# Patient Record
Sex: Male | Born: 1980 | Race: Black or African American | Hispanic: No | Smoking: Former smoker
Health system: Southern US, Community
[De-identification: ages and names within clinical notes are randomized; demographics above are authoritative.]

## PROBLEM LIST (undated history)

## (undated) HISTORY — PX: APPENDECTOMY: SHX54

---

## 2006-09-01 ENCOUNTER — Inpatient Hospital Stay (HOSPITAL_COMMUNITY): Admission: EM | Admit: 2006-09-01 | Discharge: 2006-09-02 | Payer: Self-pay | Admitting: Emergency Medicine

## 2006-09-01 ENCOUNTER — Encounter (INDEPENDENT_AMBULATORY_CARE_PROVIDER_SITE_OTHER): Payer: Self-pay | Admitting: Surgery

## 2008-06-01 ENCOUNTER — Ambulatory Visit: Payer: Self-pay | Admitting: Internal Medicine

## 2008-06-01 DIAGNOSIS — M797 Fibromyalgia: Secondary | ICD-10-CM

## 2008-06-01 DIAGNOSIS — M790 Rheumatism, unspecified: Secondary | ICD-10-CM | POA: Insufficient documentation

## 2008-06-01 DIAGNOSIS — K029 Dental caries, unspecified: Secondary | ICD-10-CM | POA: Insufficient documentation

## 2009-01-28 ENCOUNTER — Emergency Department (HOSPITAL_COMMUNITY): Admission: EM | Admit: 2009-01-28 | Discharge: 2009-01-28 | Payer: Self-pay | Admitting: Emergency Medicine

## 2009-12-22 ENCOUNTER — Inpatient Hospital Stay (HOSPITAL_COMMUNITY): Admission: EM | Admit: 2009-12-22 | Discharge: 2009-12-25 | Payer: Self-pay | Admitting: Emergency Medicine

## 2010-01-08 ENCOUNTER — Ambulatory Visit: Payer: Self-pay | Admitting: Internal Medicine

## 2010-04-30 LAB — DIFFERENTIAL
Band Neutrophils: 0 % (ref 0–10)
Basophils Absolute: 0 10*3/uL (ref 0.0–0.1)
Basophils Relative: 0 % (ref 0–1)
Basophils Relative: 0 % (ref 0–1)
Basophils Relative: 1 % (ref 0–1)
Basophils Relative: 1 % (ref 0–1)
Basophils Relative: 2 % — ABNORMAL HIGH (ref 0–1)
Basophils Relative: 3 % — ABNORMAL HIGH (ref 0–1)
Basophils Relative: 6 % — ABNORMAL HIGH (ref 0–1)
Blasts: 0 %
Eosinophils Absolute: 0 10*3/uL (ref 0.0–0.7)
Eosinophils Absolute: 0 10*3/uL (ref 0.0–0.7)
Eosinophils Absolute: 0 10*3/uL (ref 0.0–0.7)
Eosinophils Absolute: 0 10*3/uL (ref 0.0–0.7)
Eosinophils Absolute: 0 10*3/uL (ref 0.0–0.7)
Eosinophils Absolute: 0 10*3/uL (ref 0.0–0.7)
Eosinophils Relative: 0 % (ref 0–5)
Eosinophils Relative: 0 % (ref 0–5)
Eosinophils Relative: 0 % (ref 0–5)
Eosinophils Relative: 0 % (ref 0–5)
Eosinophils Relative: 1 % (ref 0–5)
Eosinophils Relative: 1 % (ref 0–5)
Eosinophils Relative: 1 % (ref 0–5)
Lymphocytes Relative: 21 % (ref 12–46)
Lymphocytes Relative: 26 % (ref 12–46)
Lymphocytes Relative: 32 % (ref 12–46)
Lymphocytes Relative: 44 % (ref 12–46)
Lymphocytes Relative: 45 % (ref 12–46)
Lymphs Abs: 0.8 10*3/uL (ref 0.7–4.0)
Lymphs Abs: 1.1 10*3/uL (ref 0.7–4.0)
Metamyelocytes Relative: 0 %
Monocytes Absolute: 0 10*3/uL — ABNORMAL LOW (ref 0.1–1.0)
Monocytes Absolute: 0.1 10*3/uL (ref 0.1–1.0)
Monocytes Absolute: 0.4 10*3/uL (ref 0.1–1.0)
Monocytes Absolute: 0.6 10*3/uL (ref 0.1–1.0)
Monocytes Absolute: 0.7 10*3/uL (ref 0.1–1.0)
Monocytes Relative: 11 % (ref 3–12)
Monocytes Relative: 18 % — ABNORMAL HIGH (ref 3–12)
Monocytes Relative: 2 % — ABNORMAL LOW (ref 3–12)
Monocytes Relative: 6 % (ref 3–12)
Monocytes Relative: 8 % (ref 3–12)
Myelocytes: 0 %
Myelocytes: 0 %
Neutro Abs: 2.3 10*3/uL (ref 1.7–7.7)
Neutro Abs: 2.7 10*3/uL (ref 1.7–7.7)
Neutro Abs: 3 10*3/uL (ref 1.7–7.7)
Neutrophils Relative %: 48 % (ref 43–77)
Neutrophils Relative %: 49 % (ref 43–77)
Neutrophils Relative %: 53 % (ref 43–77)
Neutrophils Relative %: 54 % (ref 43–77)
Neutrophils Relative %: 55 % (ref 43–77)
Neutrophils Relative %: 57 % (ref 43–77)
Neutrophils Relative %: 63 % (ref 43–77)
nRBC: 145 /100 WBC — ABNORMAL HIGH

## 2010-04-30 LAB — CROSSMATCH
Unit division: 0
Unit division: 0
Unit division: 0
Unit division: 0

## 2010-04-30 LAB — COMPREHENSIVE METABOLIC PANEL
ALT: 45 U/L (ref 0–53)
ALT: 59 U/L — ABNORMAL HIGH (ref 0–53)
AST: 46 U/L — ABNORMAL HIGH (ref 0–37)
Albumin: 3 g/dL — ABNORMAL LOW (ref 3.5–5.2)
Albumin: 3.2 g/dL — ABNORMAL LOW (ref 3.5–5.2)
Albumin: 3.7 g/dL (ref 3.5–5.2)
Alkaline Phosphatase: 262 U/L — ABNORMAL HIGH (ref 39–117)
Alkaline Phosphatase: 335 U/L — ABNORMAL HIGH (ref 39–117)
BUN: 11 mg/dL (ref 6–23)
BUN: 12 mg/dL (ref 6–23)
BUN: 14 mg/dL (ref 6–23)
CO2: 25 mEq/L (ref 19–32)
CO2: 26 mEq/L (ref 19–32)
Calcium: 9.5 mg/dL (ref 8.4–10.5)
Chloride: 103 mEq/L (ref 96–112)
Chloride: 104 mEq/L (ref 96–112)
Creatinine, Ser: 0.76 mg/dL (ref 0.4–1.5)
Creatinine, Ser: 0.99 mg/dL (ref 0.4–1.5)
GFR calc Af Amer: >60 mL/min (ref >60)
GFR calc non Af Amer: >60 mL/min (ref >60)
Glucose, Bld: 100 mg/dL — ABNORMAL HIGH (ref 70–99)
Glucose, Bld: 178 mg/dL — ABNORMAL HIGH (ref 70–99)
Potassium: 3.6 mEq/L (ref 3.5–5.1)
Potassium: 3.7 mEq/L (ref 3.5–5.1)
Potassium: 4.6 mEq/L (ref 3.5–5.1)
Sodium: 135 mEq/L (ref 135–145)
Sodium: 137 mEq/L (ref 135–145)
Total Bilirubin: 0.7 mg/dL (ref 0.3–1.2)
Total Bilirubin: 0.8 mg/dL (ref 0.3–1.2)
Total Bilirubin: 1 mg/dL (ref 0.3–1.2)
Total Protein: 6.5 g/dL (ref 6.0–8.3)
Total Protein: 7 g/dL (ref 6.0–8.3)

## 2010-04-30 LAB — CBC
HCT: 15.2 % — ABNORMAL LOW (ref 39.0–52.0)
HCT: 16 % — ABNORMAL LOW (ref 39.0–52.0)
HCT: 20.2 % — ABNORMAL LOW (ref 39.0–52.0)
HCT: 20.6 % — ABNORMAL LOW (ref 39.0–52.0)
Hemoglobin: 5.6 g/dL — CL (ref 13.0–17.0)
Hemoglobin: 7.3 g/dL — ABNORMAL LOW (ref 13.0–17.0)
Hemoglobin: 7.7 g/dL — ABNORMAL LOW (ref 13.0–17.0)
Hemoglobin: 8.6 g/dL — ABNORMAL LOW (ref 13.0–17.0)
MCH: 26.5 pg (ref 26.0–34.0)
MCH: 26.9 pg (ref 26.0–34.0)
MCH: 27.3 pg (ref 26.0–34.0)
MCH: 27.3 pg (ref 26.0–34.0)
MCH: 27.5 pg (ref 26.0–34.0)
MCHC: 35.2 g/dL (ref 30.0–36.0)
MCHC: 35.5 g/dL (ref 30.0–36.0)
MCHC: 36.2 g/dL — ABNORMAL HIGH (ref 30.0–36.0)
MCHC: 36.3 g/dL — ABNORMAL HIGH (ref 30.0–36.0)
MCHC: 36.8 g/dL — ABNORMAL HIGH (ref 30.0–36.0)
MCV: 73.1 fL — ABNORMAL LOW (ref 78.0–100.0)
MCV: 73.8 fL — ABNORMAL LOW (ref 78.0–100.0)
MCV: 74 fL — ABNORMAL LOW (ref 78.0–100.0)
MCV: 75.6 fL — ABNORMAL LOW (ref 78.0–100.0)
MCV: 76.4 fL — ABNORMAL LOW (ref 78.0–100.0)
MCV: 76.7 fL — ABNORMAL LOW (ref 78.0–100.0)
Platelets: 44 10*3/uL — ABNORMAL LOW (ref 150–400)
Platelets: 47 10*3/uL — ABNORMAL LOW (ref 150–400)
Platelets: 59 10*3/uL — ABNORMAL LOW (ref 150–400)
Platelets: 60 10*3/uL — ABNORMAL LOW (ref 150–400)
Platelets: 64 10*3/uL — ABNORMAL LOW (ref 150–400)
Platelets: 77 10*3/uL — ABNORMAL LOW (ref 150–400)
RBC: 2.19 MIL/uL — ABNORMAL LOW (ref 4.22–5.81)
RBC: 2.73 MIL/uL — ABNORMAL LOW (ref 4.22–5.81)
RBC: 2.82 MIL/uL — ABNORMAL LOW (ref 4.22–5.81)
RBC: 3.18 MIL/uL — ABNORMAL LOW (ref 4.22–5.81)
RBC: 3.42 MIL/uL — ABNORMAL LOW (ref 4.22–5.81)
RDW: 17.2 % — ABNORMAL HIGH (ref 11.5–15.5)
RDW: 17.3 % — ABNORMAL HIGH (ref 11.5–15.5)
RDW: 17.4 % — ABNORMAL HIGH (ref 11.5–15.5)
RDW: 17.5 % — ABNORMAL HIGH (ref 11.5–15.5)
RDW: 17.7 % — ABNORMAL HIGH (ref 11.5–15.5)
WBC: 3.4 10*3/uL — ABNORMAL LOW (ref 4.0–10.5)
WBC: 3.6 10*3/uL — ABNORMAL LOW (ref 4.0–10.5)
WBC: 5 10*3/uL (ref 4.0–10.5)
WBC: 5.1 10*3/uL (ref 4.0–10.5)
WBC: 5.5 10*3/uL (ref 4.0–10.5)

## 2010-04-30 LAB — GLUCOSE, CAPILLARY
Glucose-Capillary: 108 mg/dL — ABNORMAL HIGH (ref 70–99)
Glucose-Capillary: 138 mg/dL — ABNORMAL HIGH (ref 70–99)
Glucose-Capillary: 196 mg/dL — ABNORMAL HIGH (ref 70–99)
Glucose-Capillary: 231 mg/dL — ABNORMAL HIGH (ref 70–99)
Glucose-Capillary: 258 mg/dL — ABNORMAL HIGH (ref 70–99)

## 2010-04-30 LAB — HGB ELECTROPHORESIS REFLEXED REPORT
Hemoglobin F - HGBRFX: 0.2 % (ref <2.0)
Hemoglobin S - HGBRFX: 50.3 % — ABNORMAL HIGH
Sickle Solubility Test - HGBRFX: POSITIVE — AB

## 2010-04-30 LAB — HEMOCCULT GUIAC POC 1CARD (OFFICE): Fecal Occult Bld: NEGATIVE

## 2010-04-30 LAB — MALARIA SMEAR

## 2010-04-30 LAB — INFLUENZA PANEL BY PCR (TYPE A & B): Influenza A By PCR: NEGATIVE

## 2010-04-30 LAB — ABO/RH: ABO/RH(D): O POS

## 2010-04-30 LAB — URINALYSIS, ROUTINE W REFLEX MICROSCOPIC
Bilirubin Urine: NEGATIVE
Glucose, UA: NEGATIVE mg/dL
Hgb urine dipstick: NEGATIVE
Ketones, ur: NEGATIVE mg/dL
Nitrite: NEGATIVE
Specific Gravity, Urine: 1.015 (ref 1.005–1.030)
Urobilinogen, UA: 1 mg/dL (ref 0.0–1.0)

## 2010-04-30 LAB — URINALYSIS, MICROSCOPIC ONLY
Ketones, ur: NEGATIVE mg/dL
Leukocytes, UA: NEGATIVE
Nitrite: NEGATIVE
Specific Gravity, Urine: 1.011 (ref 1.005–1.030)
pH: 7.5 (ref 5.0–8.0)

## 2010-04-30 LAB — HEMOGLOBINOPATHY EVALUATION
Hemoglobin Other: 43.8 % — ABNORMAL HIGH (ref 0.0–0.0)
Hgb A2 Quant: 3.9 % — ABNORMAL HIGH (ref 2.2–3.2)

## 2010-04-30 LAB — HEMOGLOBIN: Hemoglobin: 5.5 g/dL — CL (ref 13.0–17.0)

## 2010-04-30 LAB — LACTATE DEHYDROGENASE
LDH: 1157 U/L — ABNORMAL HIGH (ref 94–250)
LDH: 1350 U/L — ABNORMAL HIGH (ref 94–250)

## 2010-04-30 LAB — GAMMA GT: GGT: 88 U/L — ABNORMAL HIGH (ref 7–51)

## 2010-04-30 LAB — MAGNESIUM: Magnesium: 1.8 mg/dL (ref 1.5–2.5)

## 2010-04-30 LAB — TSH: TSH: 3.963 u[IU]/mL (ref 0.350–4.500)

## 2010-04-30 LAB — GLUCOSE 6 PHOSPHATE DEHYDROGENASE: G-6-PD, Quant: 2.3 U/GM HGB — ABNORMAL LOW (ref 7.0–20.5)

## 2010-04-30 LAB — HIV ANTIBODY (ROUTINE TESTING W REFLEX): HIV: NONREACTIVE

## 2010-04-30 LAB — URINE CULTURE

## 2010-07-02 NOTE — Op Note (Signed)
Christopher Krueger, Christopher Krueger               ACCOUNT NO.:  [id number]   MEDICAL RECORD NO.:  [id number]          PATIENT TYPE:  INP   LOCATION:  2550                         FACILITY:  [facility]   PHYSICIAN:  Sandria Bales. Ezzard Standing, M.D.  DATE OF BIRTH:  October 08, 1980   DATE OF PROCEDURE:  DATE OF DISCHARGE:                               OPERATIVE REPORT   POSTOPERATIVE DIAGNOSIS:  Appendicitis.   POSTOPERATIVE DIAGNOSIS:  Appendicitis.   PROCEDURE:  Laparoscopic appendectomy.   SURGEON:  Sandria Bales. Ezzard Standing, M.D.   ANESTHESIA:  General endotracheal.   ASSISTANT:  None.   ESTIMATED BLOOD LOSS:  Minimal.   INDICATIONS FOR PROCEDURE:  [name] is a 30 year old black male who  hales from [place].  He has no primary medical doctor, who presented with  a 24-hour history of abdominal pain localized to the right lower  quadrant.  A CT scan confirmed appendicitis.  The patient now comes for  attempted laparoscopic appendectomy.   The patient's English skills are limited.  I used the translator to  describe the procedure, the potential risks of the procedure and he now  comes for attempted laparoscopic appendectomy.  The indications,  potential complications were explained.  Potential complications include  bleeding, infection, which I think he already has, bowel resection, the  possibility of open surgery.   OPERATIVE NOTE:  The patient was taken to the operating room, had a  timeout performed identifying the procedure and the patient.  He was  given a gram of Cefoxitin at initiation the procedure.  He had a PS  socket in place, a Foley catheter place.   The abdomen was prepped with Betadine solution and sterilely draped.  An  infraumbilical incision was made with sharp dissection carried down to  the abdominal cavity.  A 0 degree 10-mm laparoscope was inserted through  a 12-mm Hasson trocar and the Hasson trocar was secured with 0 Vicryl  suture.  The additional 5-mm trocar was placed in the right  upper  quadrant and a 10-mm in the left lower quadrant.  Abdominal exploration  revealed the right and left lobe of liver were unremarkable.  The bowel  that I could see was unremarkable.  He had very little intraabdominal  fat so I could see very well his abdominal wall and his pelvis, his  colon, cecum, some transverse colon, sigmoid colon.   He did have an inflamed appendix with minimal purulence.  I grasped the  appendix, took the mesentery down using the harmonic scalpel.  I got to  the base of the appendix where I used a 45-mm GIA stapler and fired  across the base of the appendix and delivered the appendix through the  umbilicus in an EndoCatch bag.  I then irrigated the appendiceal stump  and reinspected it.  There was no leak or injury to the bowel.  The  bowel looked viable.  He had really minimal contamination overall so he  would be a clean contaminated case.   I then removed the trocars and in turn, the umbilical port was closed  with 0 Vicryl suture.  The  skin at each site was closed with a 5-0  Vicryl suture, painted with tincture of benzoin and Steri-Strip.  The  patient tolerated the procedure well, was transferred to recovery room  in good condition. will plan to keep him overnight, leave him on  antibiotics and possibly go home tomorrow.      Sandria Bales. Ezzard Standing, M.D.  Electronically Signed     DHN/MEDQ  D:  09/01/2006  T:  09/01/2006  Job:  [id number]

## 2010-07-02 NOTE — H&P (Signed)
Christopher Krueger, Christopher Krueger               ACCOUNT NO.:  [id number]   MEDICAL RECORD NO.:  [id number]          PATIENT TYPE:  INP   LOCATION:  2550                         FACILITY:  [facility]   PHYSICIAN:  Sandria Bales. Ezzard Standing, M.D.  DATE OF BIRTH:  [birth year]   DATE OF ADMISSION:  09/01/2006  DATE OF DISCHARGE:                              HISTORY & PHYSICAL   HISTORY OF ILLNESS:  This is a 30 year old black male who comes from  [place].  His primary language is [place] who has about a 24-hour history  of abdominal pain which is localized to the right lower quadrant.  He  has a history of ulcer disease going back a year, year and a half though  he says he is not on any medicine right now.  He says he has only been  in this country for a little over a month.  His wife is in [place].  I am not sure she speaks any better English than he does.  Apparently  had a friend here earlier who spoke fairly fluent [place] and [place].   My history is obtained primarily through the translator phone and  speaking to him.   ALLERGIES:  He has no allergies.   MEDICATIONS:  He is on no medicines though again he was on some medicine  for ulcer disease about a year, a year and a half ago.   REVIEW OF SYSTEMS:  PULMONARY:  No pneumonia, tuberculosis.  CARDIAC:  No heart disease or chest pain.  GASTROINTESTINAL:  See History of Present Illness.  UROLOGIC:  No history of kidney stones or kidney infections.   SOCIAL HISTORY:  He lives with his brother.  As best I can tell he is  unemployed.  Again his wife is traveling in [place].   PHYSICAL EXAMINATION:  VITAL SIGNS:  Temperature 97, blood pressure  114/71, pulse 71, respirations 20.  GENERAL:  He is a well-nourished thin black male, alert and cooperative  on physical exam.  HEENT:  Unremarkable.  NECK:  Supple.  I felt no mass, no thyromegaly.  LUNGS:  Clear to auscultation.  HEART:  Has regular rate and rhythm without murmur or rub.  ABDOMEN:  He has  tenderness in his lower abdomen to the right lower  quadrant with some guarding.  He has decreased bowel sounds.  He has no  prior abdominal scars.  EXTREMITIES:  He has good strength in all four extremities.   LABORATORY DATA:  Sodium 141, potassium 4, chloride 105, BUN 7, glucose  104.  Hemoglobin 12.1, hematocrit 35, white blood count 15,500.  His  urinalysis was negative.   He had a CT scan which shows a dilated appendix with proximal  appendolith.  This is all consistent with appendicitis.   IMPRESSION AND PLAN:  I explained to him through the interpreter the diagnosis and needs for  surgery so diagnosis of appendicitis.  I discussed with him the risks  including bleeding, infection, bowel resection, possible open surgery,  and will plan a laparoscopic appendectomy today.      Sandria Bales. Ezzard Standing, M.D.  Electronically  Signed     DHN/MEDQ  D:  09/01/2006  T:  09/01/2006  Job:  [id number]

## 2010-07-05 NOTE — Discharge Summary (Signed)
NAMEGILMORE, LIST               ACCOUNT NO.:  [id number]   MEDICAL RECORD NO.:  [id number]          PATIENT TYPE:  INP   LOCATION:  5731                         FACILITY:  [facility]   PHYSICIAN:  Sandria Bales. Ezzard Standing, M.D.  DATE OF BIRTH:  [birth year]   DATE OF ADMISSION:  09/01/2006  DATE OF DISCHARGE:  09/02/2006                               DISCHARGE SUMMARY   CHIEF COMPLAINT/REASON FOR ADMISSION:  [name] is a 30 year old  African male, from [place], who speaks mainly [place], who had 24 hours  worth of abdominal pain in the right lower quadrant who is afebrile, but  his white count was elevated at 15,500.  CT scan demonstrated a dilated  appendix with a proximal appendicolith, consistent with appendicitis.  The patient was admitted with a diagnosis of acute appendicitis.   OPERATION:  laparoscopic appendectomy   HOSPITAL COURSE:  The patient was admitted and with the use of a  translator for [place], procedure and risks and benefits of appendectomy  were discussed with the patient and Dr. Ezzard Standing and the patient was  subsequently, therefore, taken to the OR where he underwent a  laparoscopic appendectomy without incident.  On postop day 1, patient  was tolerating a diet and pain medicine, he was doing well.  Discharge  instructions were discussed via the translator on the telephone and the  patient verbalized understanding.   FINAL DISCHARGE DIAGNOSES:  1. Acute appendicitis without perforation.  2. Status post laparoscopic appendectomy.  3. Language barrier.   DISCHARGE MEDICATIONS:  1. Vicodin 5/325 one to two tabs every 4 hours as needed for pain.  2. May use Motrin or ibuprofen from the drug store in between Vicodin      doses.  Use as directed on bottle and take with food or snack.   DIET:  No restrictions.   WOUND CARE:  Allow Steri-Strips to fall off.   ACTIVITY:  May increase activity slowly.  May walk up steps.  No shower  for the next 2 weeks.  No lifting over 15  pounds for the next 2 weeks.   FOLLOWUP APPOINTMENT:  You are to see Dr. Ezzard Standing on Wednesday, July 30,  at 12 p.m.  Telephone number [phone].   ADDITIONAL INSTRUCTIONS:  A.  You are to call the surgeon if you have  fever more than 38 degrees Centigrade.  B.  New or increase belly pain, not controlled with pain pills.  C.  Nausea, vomiting or diarrhea.  D.  Redness or drainage from surgical wounds.      Allison L. Rennis Harding, N.P.      Sandria Bales. Ezzard Standing, M.D.  Electronically Signed    ALE/MEDQ  D:  09/29/2006  T:  09/29/2006  Job:  [id number]

## 2010-11-17 IMAGING — CT CT ABD-PELV W/ CM
1 of 2 series · 15 of 32 positions shown, 19 images · IV contrast (APPLIED)
Comparison: 09/01/2006

CLINICAL DATA: Back pain, anemia and thrombocytopenia.  Recent long-
term immobilization on a extended airplane ride.

CT ABDOMEN AND PELVIS WITH CONTRAST
TECHNIQUE: Multidetector CT imaging of the abdomen and pelvis was
performed following the standard protocol during bolus
administration of intravenous contrast.
Contrast: 100 ml Omniscan 300 IV contrast

[Series 2: abd/pelv with 5.0 b31f st · axial · 0.66mm/px · z∈[-570,-150]mm · 15 of 92 slices shown, 19 images]
[im 4/92  soft-tissue]
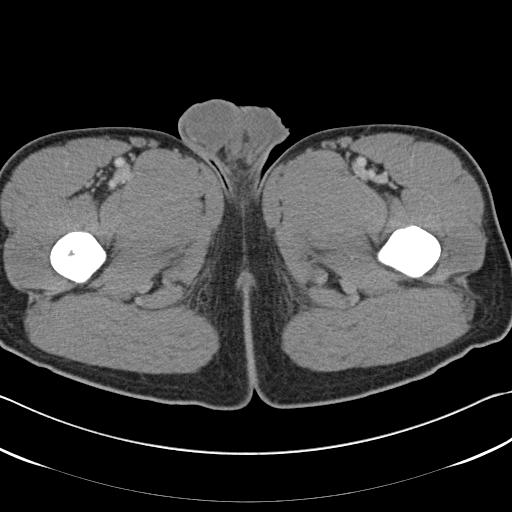
[im 4/92  bone]
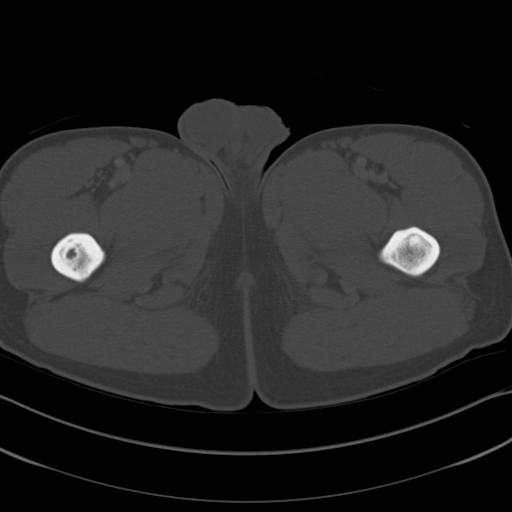
[im 11/92  soft-tissue]
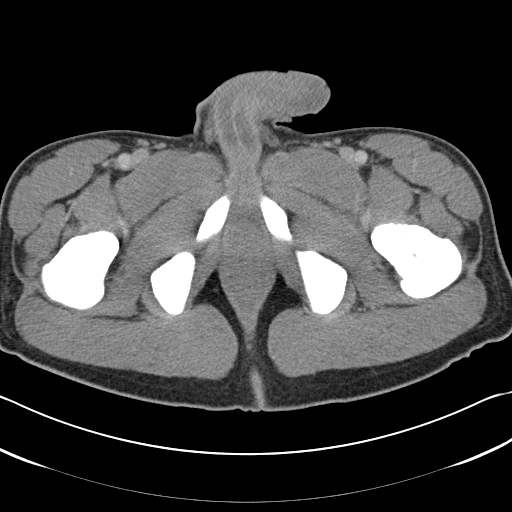
[im 19/92  soft-tissue]
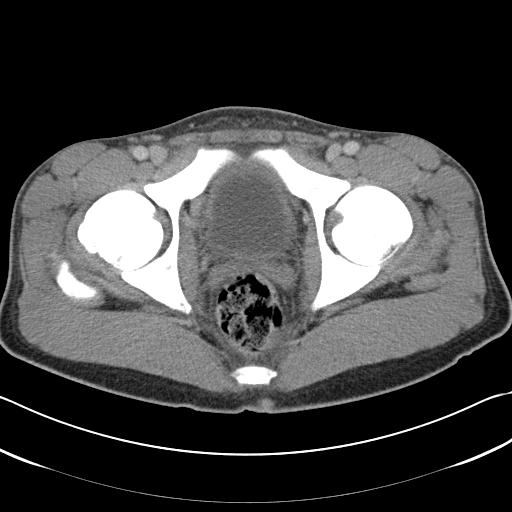
[im 26/92  soft-tissue]
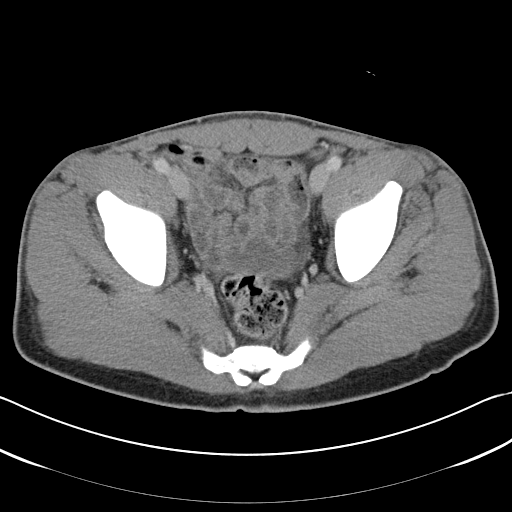
[im 33/92  soft-tissue]
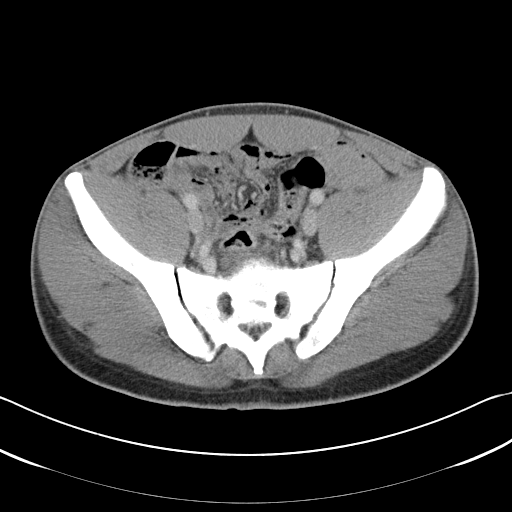
[im 41/92  soft-tissue]
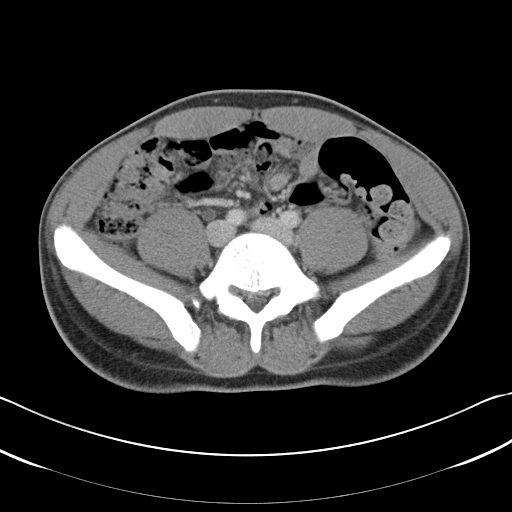
[im 48/92  soft-tissue]
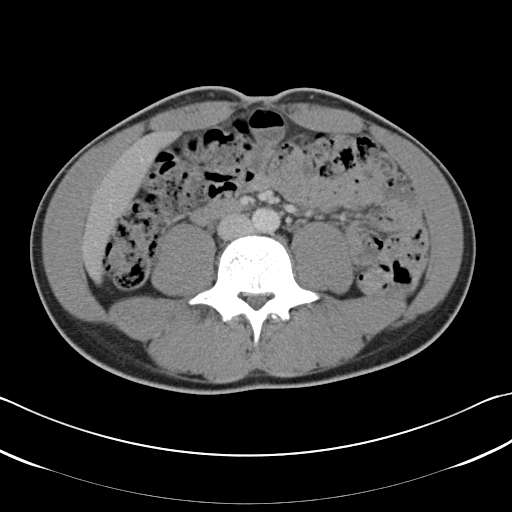
[im 51/92  soft-tissue]
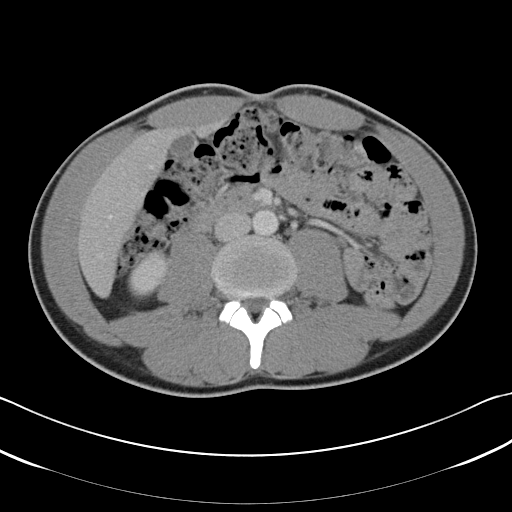
[im 59/92  soft-tissue]
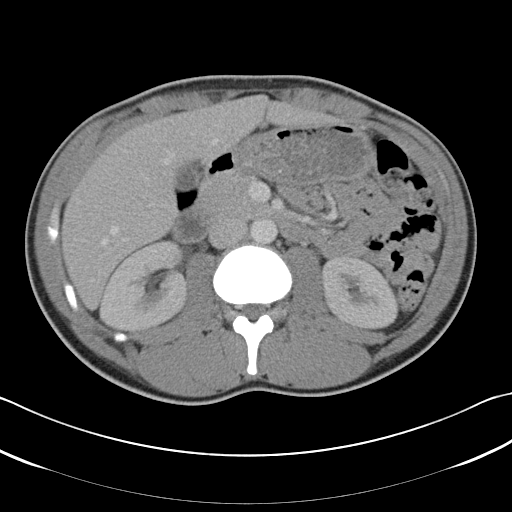
[im 59/92  bone]
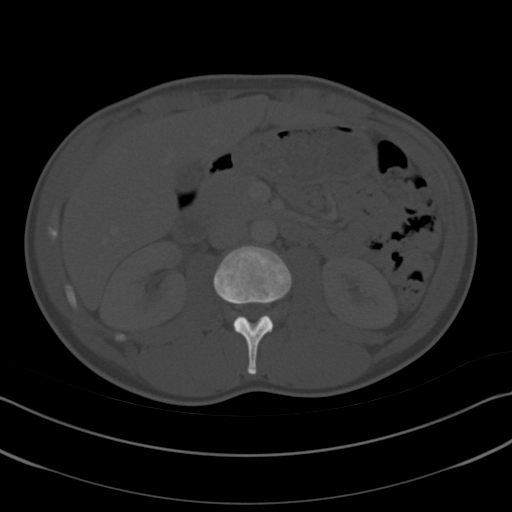
[im 66/92  soft-tissue]
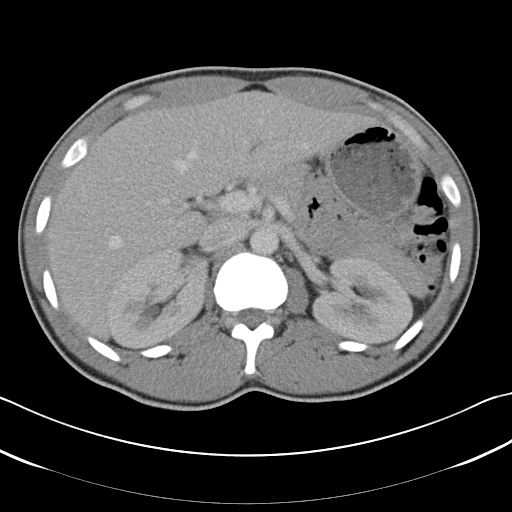
[im 73/92  soft-tissue]
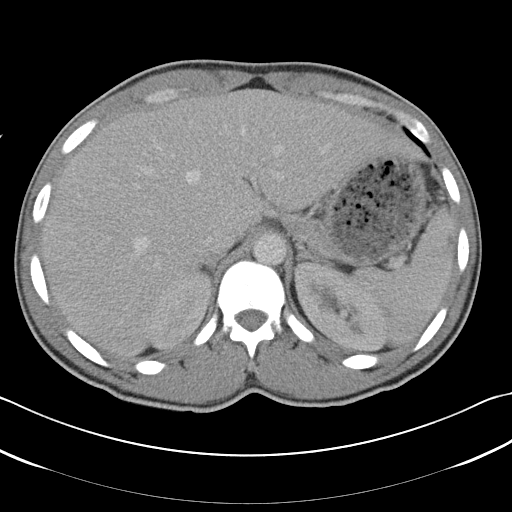
[im 77/92  lung]
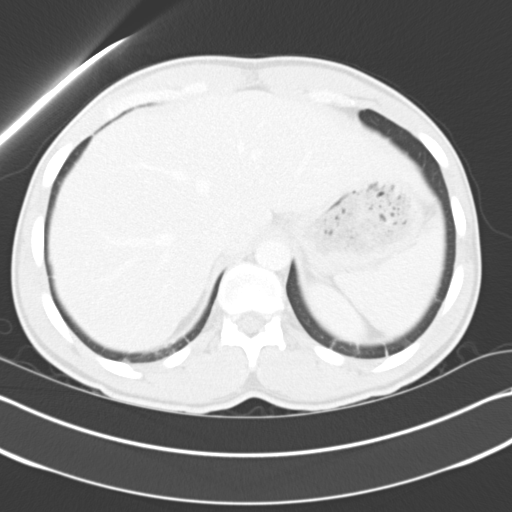
[im 81/92  soft-tissue]
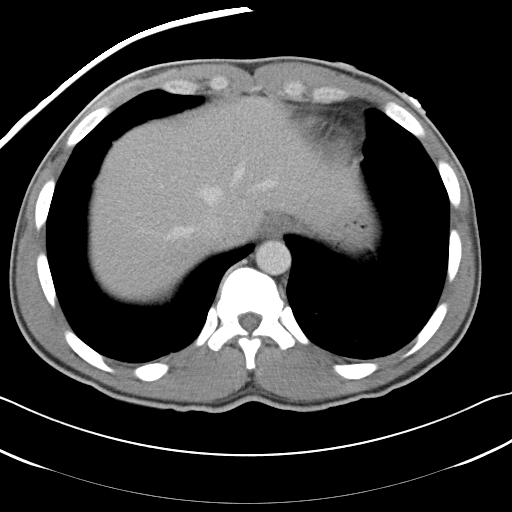
[im 81/92  lung]
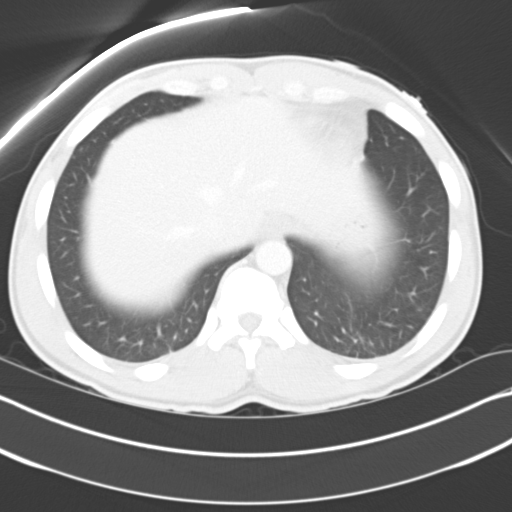
[im 84/92  lung]
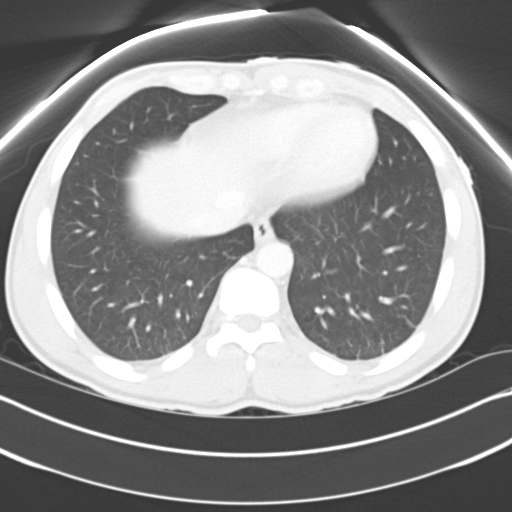
[im 88/92  soft-tissue]
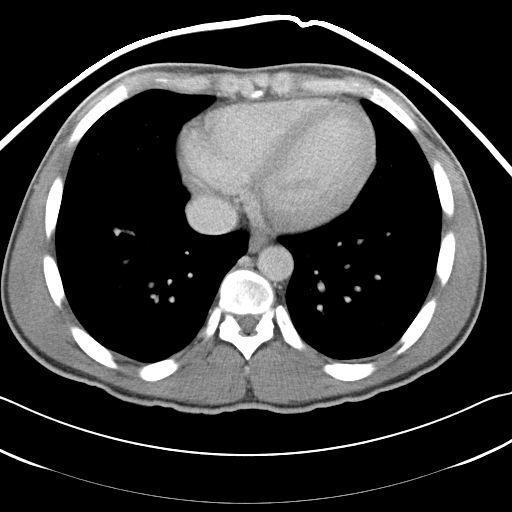
[im 88/92  lung]
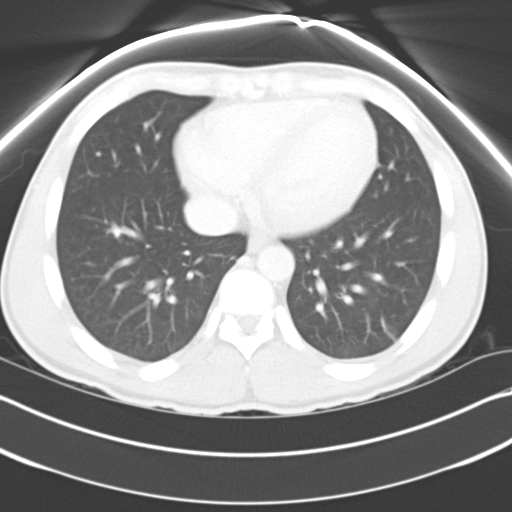

[15 of 32 positions shown; findings below may reference images not displayed]

FINDINGS: Bilateral linear subpleural atelectasis or scarring
noted.  Abdominal viscera are normal.  Paucity of body fat  and
lack of oral contrast renders evaluation of the bowel is
suboptimal.  Allowing for this, no bowel wall thickening or focal
segmental dilatation is seen.  Moderate stool volume noted
throughout nondilated colon.  Evidence of prior appendectomy.
Bladder is normal.  No ascites or lymphadenopathy.

Although the examination was not performed according to protocol
for optimal venous opacification, the IVC and external iliac veins
are normal in caliber without gross evidence for focal filling
defect.

No acute osseous abnormality. There is stable sclerosis and
mottling throughout the spine and bony pelvis, unchanged.  Partial
bilateral L5 pars interarticularis defects are incidentally noted.
No fractures identified.  Some of the lumbar vertebral bodies have
a biconcave appearance, which may be seen with sickle cell disease.
IMPRESSION: No acute intra-abdominal or pelvic pathology.  Stable mottled,
sclerotic appearance of the bones, predominately in the spine and
bony pelvis.  This appearance may suggest hemoglobinopathy,
including sickle cell disease, although marrow infiltrative
disorders such as leukemia could also have this appearance.
Depending on current ongoing testing, bone marrow biopsy may be
needed to establish a definitive diagnosis.

## 2010-12-02 LAB — URINALYSIS, ROUTINE W REFLEX MICROSCOPIC
Bilirubin Urine: NEGATIVE
Hgb urine dipstick: NEGATIVE
Ketones, ur: NEGATIVE
Nitrite: NEGATIVE
Protein, ur: NEGATIVE
Specific Gravity, Urine: 1.015
pH: 6

## 2010-12-02 LAB — I-STAT 8, (EC8 V) (CONVERTED LAB)
Acid-Base Excess: 2
Bicarbonate: 28 — ABNORMAL HIGH
Glucose, Bld: 104 — ABNORMAL HIGH
HCT: 37 — ABNORMAL LOW
Hemoglobin: 12.6 — ABNORMAL LOW
Operator id: 277751
Sodium: 141
TCO2: 30
pCO2, Ven: 49.2

## 2010-12-02 LAB — DIFFERENTIAL
Basophils Absolute: 0
Eosinophils Absolute: 0
Lymphocytes Relative: 8 — ABNORMAL LOW
Monocytes Absolute: 1.4 — ABNORMAL HIGH
Monocytes Relative: 9
Neutrophils Relative %: 83 — ABNORMAL HIGH

## 2010-12-02 LAB — CBC
Hemoglobin: 12.1 — ABNORMAL LOW
Platelets: 255
RDW: 21.3 — ABNORMAL HIGH
WBC: 15.5 — ABNORMAL HIGH

## 2010-12-02 LAB — POCT I-STAT CREATININE: Creatinine, Ser: 0.9

## 2015-04-26 ENCOUNTER — Ambulatory Visit (INDEPENDENT_AMBULATORY_CARE_PROVIDER_SITE_OTHER): Payer: No Typology Code available for payment source | Admitting: Family Medicine

## 2015-04-26 VITALS — BP 110/70 | HR 68 | Temp 98.1°F | Resp 18 | Ht 69.0 in | Wt 190.4 lb

## 2015-04-26 DIAGNOSIS — J029 Acute pharyngitis, unspecified: Secondary | ICD-10-CM

## 2015-04-26 DIAGNOSIS — R05 Cough: Secondary | ICD-10-CM | POA: Diagnosis not present

## 2015-04-26 DIAGNOSIS — J988 Other specified respiratory disorders: Secondary | ICD-10-CM

## 2015-04-26 DIAGNOSIS — J22 Unspecified acute lower respiratory infection: Secondary | ICD-10-CM

## 2015-04-26 DIAGNOSIS — R059 Cough, unspecified: Secondary | ICD-10-CM

## 2015-04-26 LAB — POCT RAPID STREP A (OFFICE): RAPID STREP A SCREEN: NEGATIVE

## 2015-04-26 MED ORDER — AZITHROMYCIN 250 MG PO TABS: ORAL_TABLET | ORAL | Status: DC

## 2015-04-26 NOTE — Progress Notes (Addendum)
Subjective:    Patient ID: Christopher Krueger, male    DOB: 11-Apr-1980, 35 y.o.   MRN: [id number] By signing my name below, I, Christopher Krueger, attest that this documentation has been prepared under the direction and in the presence of Christopher StaggersJeffrey Eban Weick, MD.  Electronically Signed: Littie Deedsichard Krueger, Medical Scribe. 04/26/2015. 10:33 AM.  HPI HPI Comments: Christopher Krueger is a 35 y.o. male who presents to the [facility] complaining of gradual onset, productive cough that started about 2 weeks ago, but worsened 4 days ago. The cough became productive of discolored sputum 4 days ago. Patient reports having associated rhinorrhea, congestion and chest soreness due to cough. He began having a sore throat 4 days ago and notes he has pain with swallowing. He has tried Chloraseptic but without relief. Patient denies fever. He notes that his 34-year-old daughter is also ill and had been put on antibiotics by a doctor.  Patient is originally from [place] and has been in the [place] for 18 years. He still has family in [place] and will be going to visit [place] in 4 days.  Patient Active Problem List   Diagnosis Date Noted  . DENTAL CARIES 06/01/2008  . RHEUMATISM 06/01/2008   History reviewed. No pertinent past medical history. Past Surgical History  Procedure Laterality Date  . Appendectomy     No Known Allergies Prior to Admission medications   Not on File   Social History   Social History  . Marital Status: Divorced    Spouse Name: N/A  . Number of Children: N/A  . Years of Education: N/A   Occupational History  . Not on file.   Social History Main Topics  . Smoking status: Never Smoker   . Smokeless tobacco: Not on file  . Alcohol Use: No  . Drug Use: No  . Sexual Activity: Not on file   Other Topics Concern  . Not on file   Social History Narrative  . No narrative on file     Review of Systems  Constitutional: Negative for fever.  HENT: Positive for congestion,  rhinorrhea and sore throat.   Respiratory: Positive for cough.        Objective:   Physical Exam  Constitutional: He is oriented to person, place, and time. He appears well-developed and well-nourished.  HENT:  Head: Normocephalic and atraumatic.  Right Ear: Tympanic membrane, external ear and ear canal normal.  Left Ear: Tympanic membrane, external ear and ear canal normal.  Nose: No rhinorrhea.  Mouth/Throat: Mucous membranes are normal. Posterior oropharyngeal erythema present. No oropharyngeal exudate.  Minimal erythema on posterior oropharynx, no discharge.  Eyes: Conjunctivae are normal. Pupils are equal, round, and reactive to light.  Neck: Neck supple.  Tenderness along anterior neck around AC nodes, but no apparent swelling.  Cardiovascular: Normal rate, regular rhythm, normal heart sounds and intact distal pulses.   No murmur heard. Pulmonary/Chest: Effort normal and breath sounds normal. He has no wheezes. He has no rhonchi. He has no rales. He exhibits tenderness.  Clear to auscultation bilaterally. Some discomfort with deep breathing. Slight tenderness along intercostal muscles of anterior chest and pectoralis.  Abdominal: Soft. There is no tenderness.  Neurological: He is alert and oriented to person, place, and time.  Skin: Skin is warm and dry. No rash noted.  Psychiatric: He has a normal mood and affect. His behavior is normal.  Vitals reviewed.   Filed Vitals:   04/26/15 0935  BP: 110/70  Pulse: 68  Temp: 98.1 F (36.7 C)  TempSrc: Oral  Resp: 18  Height:  (1.753 m)  Weight: 190 lb 6.4 oz (86.365 kg)  SpO2: 98%    Results for orders placed or performed in visit on 04/26/15  POCT rapid strep A  Result Value Ref Range   Rapid Strep A Screen Negative Negative        Assessment & Plan:   Christopher Krueger is a 35 y.o. male Cough - Plan: azithromycin (ZITHROMAX) 250 MG tablet  LRTI (lower respiratory tract infection) - Plan: azithromycin (ZITHROMAX)  250 MG tablet  Sore throat - Plan: POCT rapid strep A  - possible initial viral illness, with secondary sickening, worsening past 4 days with increased cough, discolored phlegm , sick contact at home on antibiotics for unknown condition.  Going out of the country in 4 days. Differential includes lower respiratory infection/bronchitis/early community-acquired pneumonia. Lungs clear, afebrile in the office, O2 sat okay.  -Z-Pak #1, Mucinex, Cepacol or other cough drops as needed. RTC precautions.   Meds ordered this encounter  Medications  . azithromycin (ZITHROMAX) 250 MG tablet    Sig: Take 2 pills by mouth on day 1, then 1 pill by mouth per day on days 2 through 5.    Dispense:  6 tablet    Refill:  0   Patient Instructions   Your strep test was negative, but with the increased cough and worsening of symptoms, you may have an early bronchitis or less likely pneumonia. Can start the azithromycin antibiotic 2 pills today, 1 pill in next 4 days, Mucinex or Mucinex DM as needed for cough, Cepacol cough drops or other lozenges as needed, Tylenol or Motrin as needed for body aches. Drink plenty fluids and rest. Return to the clinic or go to the nearest emergency room if any of your symptoms worsen or new symptoms occur.  Acute Bronchitis Bronchitis is inflammation of the airways that extend from the windpipe into the lungs (bronchi). The inflammation often causes mucus to develop. This leads to a cough, which is the most common symptom of bronchitis.  In acute bronchitis, the condition usually develops suddenly and goes away over time, usually in a couple weeks. Smoking, allergies, and asthma can make bronchitis worse. Repeated episodes of bronchitis may cause further lung problems.  CAUSES Acute bronchitis is most often caused by the same virus that causes a cold. The virus can spread from person to person (contagious) through coughing, sneezing, and touching contaminated objects. SIGNS AND  SYMPTOMS   Cough.   Fever.   Coughing up mucus.   Body aches.   Chest congestion.   Chills.   Shortness of breath.   Sore throat.  DIAGNOSIS  Acute bronchitis is usually diagnosed through a physical exam. Your health care provider will also ask you questions about your medical history. Tests, such as chest X-rays, are sometimes done to rule out other conditions.  TREATMENT  Acute bronchitis usually goes away in a couple weeks. Oftentimes, no medical treatment is necessary. Medicines are sometimes given for relief of fever or cough. Antibiotic medicines are usually not needed but may be prescribed in certain situations. In some cases, an inhaler may be recommended to help reduce shortness of breath and control the cough. A cool mist vaporizer may also be used to help thin bronchial secretions and make it easier to clear the chest.  HOME CARE INSTRUCTIONS  Get plenty of rest.   Drink enough fluids to keep your  urine clear or pale yellow (unless you have a medical condition that requires fluid restriction). Increasing fluids may help thin your respiratory secretions (sputum) and reduce chest congestion, and it will prevent dehydration.   Take medicines only as directed by your health care provider.  If you were prescribed an antibiotic medicine, finish it all even if you start to feel better.  Avoid smoking and secondhand smoke. Exposure to cigarette smoke or irritating chemicals will make bronchitis worse. If you are a smoker, consider using nicotine gum or skin patches to help control withdrawal symptoms. Quitting smoking will help your lungs heal faster.   Reduce the chances of another bout of acute bronchitis by washing your hands frequently, avoiding people with cold symptoms, and trying not to touch your hands to your mouth, nose, or eyes.   Keep all follow-up visits as directed by your health care provider.  SEEK MEDICAL CARE IF: Your symptoms do not improve  after 1 week of treatment.  SEEK IMMEDIATE MEDICAL CARE IF:  You develop an increased fever or chills.   You have chest pain.   You have severe shortness of breath.  You have bloody sputum.   You develop dehydration.  You faint or repeatedly feel like you are going to pass out.  You develop repeated vomiting.  You develop a severe headache. MAKE SURE YOU:   Understand these instructions.  Will watch your condition.  Will get help right away if you are not doing well or get worse.   This information is not intended to replace advice given to you by your health care provider. Make sure you discuss any questions you have with your health care provider.   Document Released: 03/13/2004 Document Revised: 02/24/2014 Document Reviewed: 07/27/2012 Elsevier Interactive Patient Education [employer]      I personally performed the services described in this documentation, which was scribed in my presence. The recorded information has been reviewed and considered, and addended by me as needed.

## 2015-04-26 NOTE — Patient Instructions (Signed)
Your strep test was negative, but with the increased cough and worsening of symptoms, you may have an early bronchitis or less likely pneumonia. Can start the azithromycin antibiotic 2 pills today, 1 pill in next 4 days, Mucinex or Mucinex DM as needed for cough, Cepacol cough drops or other lozenges as needed, Tylenol or Motrin as needed for body aches. Drink plenty fluids and rest. Return to the clinic or go to the nearest emergency room if any of your symptoms worsen or new symptoms occur.  Acute Bronchitis Bronchitis is inflammation of the airways that extend from the windpipe into the lungs (bronchi). The inflammation often causes mucus to develop. This leads to a cough, which is the most common symptom of bronchitis.  In acute bronchitis, the condition usually develops suddenly and goes away over time, usually in a couple weeks. Smoking, allergies, and asthma can make bronchitis worse. Repeated episodes of bronchitis may cause further lung problems.  CAUSES Acute bronchitis is most often caused by the same virus that causes a cold. The virus can spread from person to person (contagious) through coughing, sneezing, and touching contaminated objects. SIGNS AND SYMPTOMS   Cough.   Fever.   Coughing up mucus.   Body aches.   Chest congestion.   Chills.   Shortness of breath.   Sore throat.  DIAGNOSIS  Acute bronchitis is usually diagnosed through a physical exam. Your health care provider will also ask you questions about your medical history. Tests, such as chest X-rays, are sometimes done to rule out other conditions.  TREATMENT  Acute bronchitis usually goes away in a couple weeks. Oftentimes, no medical treatment is necessary. Medicines are sometimes given for relief of fever or cough. Antibiotic medicines are usually not needed but may be prescribed in certain situations. In some cases, an inhaler may be recommended to help reduce shortness of breath and control the cough.  A cool mist vaporizer may also be used to help thin bronchial secretions and make it easier to clear the chest.  HOME CARE INSTRUCTIONS  Get plenty of rest.   Drink enough fluids to keep your urine clear or pale yellow (unless you have a medical condition that requires fluid restriction). Increasing fluids may help thin your respiratory secretions (sputum) and reduce chest congestion, and it will prevent dehydration.   Take medicines only as directed by your health care provider.  If you were prescribed an antibiotic medicine, finish it all even if you start to feel better.  Avoid smoking and secondhand smoke. Exposure to cigarette smoke or irritating chemicals will make bronchitis worse. If you are a smoker, consider using nicotine gum or skin patches to help control withdrawal symptoms. Quitting smoking will help your lungs heal faster.   Reduce the chances of another bout of acute bronchitis by washing your hands frequently, avoiding people with cold symptoms, and trying not to touch your hands to your mouth, nose, or eyes.   Keep all follow-up visits as directed by your health care provider.  SEEK MEDICAL CARE IF: Your symptoms do not improve after 1 week of treatment.  SEEK IMMEDIATE MEDICAL CARE IF:  You develop an increased fever or chills.   You have chest pain.   You have severe shortness of breath.  You have bloody sputum.   You develop dehydration.  You faint or repeatedly feel like you are going to pass out.  You develop repeated vomiting.  You develop a severe headache. MAKE SURE YOU:   Understand these  instructions.  Will watch your condition.  Will get help right away if you are not doing well or get worse.   This information is not intended to replace advice given to you by your health care provider. Make sure you discuss any questions you have with your health care provider.   Document Released: 03/13/2004 Document Revised: 02/24/2014 Document  Reviewed: 07/27/2012 Elsevier Interactive Patient Education [employer]

## 2017-05-26 ENCOUNTER — Encounter: Payer: Self-pay | Admitting: Hematology

## 2017-06-15 NOTE — Progress Notes (Signed)
HEMATOLOGY/ONCOLOGY CONSULTATION NOTE  Date of Service: 06/17/17  Patient Care Team: Julieanne Manson, MD as PCP - General  CHIEF COMPLAINTS/PURPOSE OF CONSULTATION:  Sickle cell disease   HISTORY OF PRESENTING ILLNESS:   Christopher Krueger is a wonderful 37 y.o. male who has been referred to [place] by Dr. Brett Fairy for evaluation and management of sickle cell disease. The pt reports that he is doing well overall.   The pt reports that he last saw an Hematologist about 20 years ago while living in [place] in [place]. He remembers that he could not perform in athletic activities due to severe joint/bone and Muscle pains, which would also onset with very hot or cold weather. He believes that he was diagnosed with Hemoglobin [place] disease in 1998 when he was 37 y/o, at which time he had not had a blood transfusion. He notes that his first blood transfusion was when he was 24, in 2006 or so.  He moved to the [place] in 2008 and had a second blood transfusion in about 2011. He notes that his need for transfusions were correlated with infections. He notes that he has used a Malaria medication in the past. He notes that occasionally he develops acute muscle/joint pain for which he uses OTC pain medication. He notes that he has severe pain for up to 3 days after periods of over-exertion, but denies these episodes occurring unprovoked. During these 3 day periods he notes that he has to stay in bed. He also notes that he gets tired quickly and has SOB from walking around. He notes that his SOB has remained steady for his whole life.   The pt took folic acid replacement between the ages of 60 and 11.   He has never had red cell exchanges, and did not require a blood transfusion with his 2008 appendectomy. He has not had any strokes, heart attacks, or blood clots. He has had pneumonia 3 years ago.  He notes that his pain crises do not repeat at the same locations each time. He denies every being diagnosed  with avascular necrosis of bone/joint.   He denies ever having an eye examination and currently denies any problems with vision or his eyes. He also notes that he has received vaccinations but it not sure which he has received.  He notes that he drinks lots of water, but sometimes does not feel hungry enough to eat all 3 meals. He notes that he drinks 4-5 L of water each day including 1 L of hot tea from the combretum micranthum tree. He also drinks 5-6 Monster energy drinks each month.   He notes that he is generally health and has not had many problems with infections.   Most recent lab results (05/19/17) of CBC  is as follows: all values are WNL except for RBC at 3.93, Hgb at 12.0, HCT at 33.6, RDW at 17.4.   On review of systems, pt reports occasional muscle/bone pains, exercise induced SOB, fatigue, and denies skin rashes, red or swollen joints, painful joints, blood in the urine, discolored urine, chest pain, neck pain, weight changes, abdominal pains, leg pain, hand swelling, hip pain, and any other symptoms.   On PMHx the pt reports rheumatism and sickle cell disease. Denies blood clots.  On Surgical Hx the pt reports appendectomy in 2008.  On Social Hx the pt denies any ETOH consumption, and he quit smoking in 2012. He is married with 3 kids. His work entails Psychologist, occupational and  machine operation.  On Family Hx the pt reports paternal Stevens disease.   MEDICAL HISTORY:   Hemoglobin [place] disease  SURGICAL HISTORY: Past Surgical History:  Procedure Laterality Date  . APPENDECTOMY      SOCIAL HISTORY: Social History   Socioeconomic History  . Marital status: Divorced    Spouse name: Not on file  . Number of children: Not on file  . Years of education: Not on file  . Highest education level: Not on file  Occupational History  . Not on file  Social Needs  . Financial resource strain: Not on file  . Food insecurity:    Worry: Not on file    Inability: Not on file  . Transportation  needs:    Medical: Not on file    Non-medical: Not on file  Tobacco Use  . Smoking status: Former Smoker    Packs/day: 0.25    Types: Cigarettes    Last attempt to quit: 2014    Years since quitting: 5.3  . Smokeless tobacco: Never Used  Substance and Sexual Activity  . Alcohol use: No    Alcohol/week: 0.0 oz  . Drug use: No  . Sexual activity: Not on file  Lifestyle  . Physical activity:    Days per week: Not on file    Minutes per session: Not on file  . Stress: Not on file  Relationships  . Social connections:    Talks on phone: Not on file    Gets together: Not on file    Attends religious service: Not on file    Active member of club or organization: Not on file    Attends meetings of clubs or organizations: Not on file    Relationship status: Not on file  . Intimate partner violence:    Fear of current or ex partner: Not on file    Emotionally abused: Not on file    Physically abused: Not on file    Forced sexual activity: Not on file  Other Topics Concern  . Not on file  Social History Narrative  . Not on file    FAMILY HISTORY: H/o gemoglobinopathy  ALLERGIES:  has No Known Allergies.  MEDICATIONS:  No current outpatient medications on file.   No current facility-administered medications for this visit.     REVIEW OF SYSTEMS:    10 Point review of Systems was done is negative except as noted above.  PHYSICAL EXAMINATION:  . Vitals:   06/17/17 1034  BP: 122/72  Pulse: 63  Resp: 18  Temp: 98.5 F (36.9 C)  SpO2: 99%   Filed Weights   06/17/17 1034  Weight: 190 lb 12.8 oz (86.5 kg)   .Body mass index is 28.18 kg/m.  GENERAL:alert, in no acute distress and comfortable SKIN: no acute rashes, no significant lesions EYES: conjunctiva are pink and non-injected, sclera anicteric OROPHARYNX: MMM, no exudates, no oropharyngeal erythema or ulceration NECK: supple, no JVD LYMPH:  no palpable lymphadenopathy in the cervical, axillary or inguinal  regions LUNGS: clear to auscultation b/l with normal respiratory effort HEART: regular rate & rhythm ABDOMEN:  normoactive bowel sounds , non tender, not distended. No palpable hepatomegaly or splenomegaly. Extremity: no pedal edema PSYCH: alert & oriented x 3 with fluent speech NEURO: no focal motor/sensory deficits  LABORATORY DATA:  I have reviewed the data as listed  . CBC Latest Ref Rng & Units 06/17/2017 06/17/2017 12/25/2009  WBC 4.0 - 10.3 K/uL 5.0 - 5.1 ADJUSTED FOR NUCLEATED RBC'S  Hemoglobin 13.0 - 17.1 g/dL 11.4(L) - 8.5(L)  Hematocrit 37.5 - 51.0 % 32.7(L) 31.7(L) 24.4(L)  Platelets 140 - 400 K/uL 198 - 60(L)    . CMP Latest Ref Rng & Units 06/17/2017 12/25/2009 12/24/2009  Glucose 70 - 140 mg/dL 80 161(W) 960(A)  BUN 7 - 26 mg/dL Creatinine 0.70 - 1.30 mg/dL 5.40 9.81 1.91  Sodium 136 - 145 mmol/L 140 137 141  Potassium 3.5 - 5.1 mmol/L 3.9 3.6 3.7  Chloride 98 - 109 mmol/L 106 104 104  CO2 22 - 29 mmol/L Calcium 8.4 - 10.4 mg/dL 9.7 9.2 9.5  Total Protein 6.4 - 8.3 g/dL 7.8 6.5 7.0  Total Bilirubin 0.2 - 1.2 mg/dL 1.1 0.7 0.9  Alkaline Phos 40 - 150 U/L 85 262(H) 296(H)  AST 5 - 34 U/L 32 46(H) 53(H)  ALT 0 - 55 U/L 21 59(H) 56(H)   Component     Latest Ref Rng & Units 06/17/2017  Iron     42 - 163 ug/dL 70  TIBC     478 - 295 ug/dL 621  Saturation Ratios     42 - 163 % 24 (L)  UIBC     ug/dL 308  Folate, Hemolysate     Not Estab. ng/mL 312.0  HCT     37.5 - 51.0 % 32.7 (L)  Folate, RBC     >498 ng/mL 954  Retic Ct Pct     0.8 - 1.8 % 6.1 (H)  RBC.     4.20 - 5.82 MIL/uL 3.92 (L)  Retic Count, Absolute     34.8 - 93.9 K/uL 239.1 (H)  LDH     125 - 245 U/L 243  Hep B Core Ab, Tot     Negative Negative  Hepatitis B Surface Ag     Negative Negative  HCV Ab     0.0 - 0.9 s/co ratio <0.1  HIV Screen 4th Generation wRfx     Non Reactive Non Reactive  Vitamin B12     180 - 914 pg/mL 478  Ferritin     22 - 316 ng/mL 58    Component     Latest Ref Rng & Units 06/17/2017  Hgb A2 Quant     1.8 - 3.2 % 3.9 (H)  Hgb F Quant     0.0 - 2.0 % 2.6 (H)  Hgb S Quant     0.0 % 50.4 (H)  HGB C     0.0 % 43.1 (H)  Hgb A     96.4 - 98.8 % 0.0 (L)  HGB VARIANT     0.0 % 0.0  Please Note:      Comment  Comment: (NOTE)  Hemoglobin pattern and concentrations are consistent with  S-C disease. Suggest clinical and hematologic correlation.      S-C Disease Interpretation Ranges      Hgb F    0.0 - 20.0%      Hgb S    40.0 - 60.0%      Hgb C    35.0 - 50.0%      Hgb A2    2.8 - 6.0%        RADIOGRAPHIC STUDIES: I have personally reviewed the radiological images as listed and agreed with the findings in the report. No results found.  ASSESSMENT & PLAN:  37 y.o. 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 Baseline hbg 11-12  Significant reticlocytosis as expected. -Pain crises are not unprovoked and arise after increased activity.  -not requiring recent or frequent PRBC transfusions. -no target persistently painful or swollen joints at this time. PLAN -labs done today as noted - Hb S-C disease confirmed. No overt associated thal trait. --Advised that pt continue to stay well hydratedwith 48-60 Oz of water daily. -Advised that the pt continue to stay up to date on his vaccinations to reduce his risk of infections. 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  po daily  -PO iron to maintain ferritin >50 -Would recommend that the pt get an eye exam.  -No need for XR at this time as pt does not have joint pain that remains. -In general, the patient's [place] disease seems to be on the milder side.  -Advised that in times of fasting, the pt continue to drink water.  -consider baseline ECHO to evaluate for pulmonary HTN. -consider [place] abd to evaluate splenic size status. -I am happy to  refer this pt to our sickle cell service for continual evaluation and management of his [place] disease.   Labs today RTC with Dr Candise Che in 2 weeks F/u with Sickle cell service for continued Sickle cell cares    All of the patients questions were answered with apparent satisfaction. The patient knows to call the clinic with any problems, questions or concerns.  I spent 45 minutes counseling the patient face to face. The total time spent in the appointment was 60 minutes and more than 50% was on counseling and direct patient cares.    Wyvonnia Lora MD MS AAHIVMS [facility] Hematology/Oncology Physician [facility]  (Office):       [phone] (Work cell):  [phone] (Fax):           [phone]  06/17/2017 10:52 AM  This document serves as a record of services personally performed by Wyvonnia Lora, MD. It was created on his behalf by Marcelline Mates, a trained medical scribe. The creation of this record is based on the scribe's personal observations and the provider's statements to them.   .I have reviewed the above documentation for accuracy and completeness, and I agree with the above. Johney Maine MD MS

## 2017-06-17 ENCOUNTER — Telehealth: Payer: Self-pay

## 2017-06-17 ENCOUNTER — Inpatient Hospital Stay: Payer: BLUE CROSS/BLUE SHIELD

## 2017-06-17 ENCOUNTER — Inpatient Hospital Stay: Payer: BLUE CROSS/BLUE SHIELD | Attending: Hematology | Admitting: Hematology

## 2017-06-17 ENCOUNTER — Encounter: Payer: Self-pay | Admitting: Hematology

## 2017-06-17 VITALS — BP 122/72 | HR 63 | Temp 98.5°F | Resp 18 | Ht 69.0 in | Wt 190.8 lb

## 2017-06-17 DIAGNOSIS — R7989 Other specified abnormal findings of blood chemistry: Secondary | ICD-10-CM | POA: Insufficient documentation

## 2017-06-17 DIAGNOSIS — Z9049 Acquired absence of other specified parts of digestive tract: Secondary | ICD-10-CM | POA: Insufficient documentation

## 2017-06-17 DIAGNOSIS — Z87891 Personal history of nicotine dependence: Secondary | ICD-10-CM | POA: Diagnosis not present

## 2017-06-17 DIAGNOSIS — R701 Abnormal plasma viscosity: Secondary | ICD-10-CM | POA: Diagnosis not present

## 2017-06-17 DIAGNOSIS — Z832 Family history of diseases of the blood and blood-forming organs and certain disorders involving the immune mechanism: Secondary | ICD-10-CM

## 2017-06-17 DIAGNOSIS — D5720 Sickle-cell/Hb-C disease without crisis: Secondary | ICD-10-CM

## 2017-06-17 LAB — CMP (CANCER CENTER ONLY)
ALK PHOS: 85 U/L (ref 40–150)
ALT: 21 U/L (ref 0–55)
AST: 32 U/L (ref 5–34)
Albumin: 4.3 g/dL (ref 3.5–5.0)
Anion gap: 6 (ref 3–11)
BILIRUBIN TOTAL: 1.1 mg/dL (ref 0.2–1.2)
BUN: 11 mg/dL (ref 7–26)
CHLORIDE: 106 mmol/L (ref 98–109)
CO2: 28 mmol/L (ref 22–29)
CREATININE: 0.88 mg/dL (ref 0.70–1.30)
Calcium: 9.7 mg/dL (ref 8.4–10.4)
GFR, Est AFR Am: >60 mL/min (ref >60)
Glucose, Bld: 80 mg/dL (ref 70–140)
Potassium: 3.9 mmol/L (ref 3.5–5.1)
Sodium: 140 mmol/L (ref 136–145)
Total Protein: 7.8 g/dL (ref 6.4–8.3)

## 2017-06-17 LAB — CBC WITH DIFFERENTIAL (CANCER CENTER ONLY)
BASOS PCT: 0 %
Basophils Absolute: 0 10*3/uL (ref 0.0–0.1)
EOS ABS: 0.2 10*3/uL (ref 0.0–0.5)
Eosinophils Relative: 3 %
HCT: 31.7 % — ABNORMAL LOW (ref 38.4–49.9)
Hemoglobin: 11.4 g/dL — ABNORMAL LOW (ref 13.0–17.1)
LYMPHS ABS: 2 10*3/uL (ref 0.9–3.3)
Lymphocytes Relative: 39 %
MCH: 29.1 pg (ref 27.2–33.4)
MCHC: 36 g/dL (ref 32.0–36.0)
MCV: 80.9 fL (ref 79.3–98.0)
Monocytes Absolute: 0.5 10*3/uL (ref 0.1–0.9)
Monocytes Relative: 10 %
NEUTROS PCT: 48 %
NRBC: 4 /100{WBCs} — AB
Neutro Abs: 2.4 10*3/uL (ref 1.5–6.5)
PLATELETS: 198 10*3/uL (ref 140–400)
RBC: 3.92 MIL/uL — ABNORMAL LOW (ref 4.20–5.82)
RDW: 17.3 % — ABNORMAL HIGH (ref 11.0–14.6)
WBC: 5 10*3/uL (ref 4.0–10.3)

## 2017-06-17 LAB — IRON AND TIBC
Iron: 70 ug/dL (ref 42–163)
Saturation Ratios: 24 % — ABNORMAL LOW (ref 42–163)
TIBC: 296 ug/dL (ref 202–409)
UIBC: 226 ug/dL

## 2017-06-17 LAB — FERRITIN: Ferritin: 58 ng/mL (ref 22–316)

## 2017-06-17 LAB — RETICULOCYTES
RBC.: 3.92 MIL/uL — ABNORMAL LOW (ref 4.20–5.82)
RETIC COUNT ABSOLUTE: 239.1 10*3/uL — AB (ref 34.8–93.9)
RETIC CT PCT: 6.1 % — AB (ref 0.8–1.8)

## 2017-06-17 LAB — VITAMIN B12: Vitamin B-12: 478 pg/mL (ref 180–914)

## 2017-06-17 LAB — LACTATE DEHYDROGENASE: LDH: 243 U/L (ref 125–245)

## 2017-06-17 NOTE — Patient Instructions (Signed)
-  Please take Folic acid  po daily and  B complex 1 tab po daily

## 2017-06-17 NOTE — Telephone Encounter (Signed)
Printed avs and calender of upcoming appointment. Per 5/1 los 

## 2017-06-17 NOTE — Telephone Encounter (Signed)
New patient of Dr. Candise Che 06/17/17 to be seen for sickle cell. Typically, patients not seen at [facility] for this diagnosis. Contacted Sickle Cell [phone] to see if patient could be seen there. Not accepting new patients at this time. Suggested trying [facility] who is not accepting new patients at this time either [phone]. Recommended calling Internal Medicine [phone]. Pt should be able to get a visit with a physician through internal medicine. Pt is to call number provided and request to speak with the Practice Administrator to schedule an appointment. Pt verbalized understanding and thanks for assistance.

## 2017-06-18 LAB — FOLATE RBC
FOLATE, HEMOLYSATE: 312 ng/mL
Folate, RBC: 954 ng/mL (ref >498)
Hematocrit: 32.7 % — ABNORMAL LOW (ref 37.5–51.0)

## 2017-06-18 LAB — HEPATITIS B SURFACE ANTIGEN: Hepatitis B Surface Ag: NEGATIVE

## 2017-06-18 LAB — HEPATITIS C ANTIBODY

## 2017-06-18 LAB — HEPATITIS B CORE ANTIBODY, TOTAL: HEP B C TOTAL AB: NEGATIVE

## 2017-06-18 LAB — HIV ANTIBODY (ROUTINE TESTING W REFLEX): HIV SCREEN 4TH GENERATION: NONREACTIVE

## 2017-06-22 LAB — HEMOGLOBINOPATHY EVALUATION
Hgb A2 Quant: 3.9 % — ABNORMAL HIGH (ref 1.8–3.2)
Hgb A: 0 % — ABNORMAL LOW (ref 96.4–98.8)
Hgb C: 43.1 % — ABNORMAL HIGH
Hgb F Quant: 2.6 % — ABNORMAL HIGH (ref 0.0–2.0)
Hgb S Quant: 50.4 % — ABNORMAL HIGH
Hgb Variant: 0 %

## 2017-06-29 NOTE — Progress Notes (Signed)
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   HISTORY OF PRESENTING ILLNESS:   Christopher Krueger is a wonderful 37 y.o. male who has been referred to [place] by Dr. 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 The pt reports that he is doing well overall.   The pt reports that he last saw an Hematologist about 20 years ago while living in [place] in [place]. He remembers that he could not perform in athletic activities due to severe joint/bone and Muscle pains, which would also onset with very hot or cold weather. He believes that he was diagnosed with Hemoglobin [place] disease in 1998 when he was 37 y/o, at which time he had not had a blood transfusion. He notes that his first blood transfusion was when he was 24, in 2006 or so.  He moved to the [place] in 2008 and had a second blood transfusion in about 2011. He notes that his need for transfusions were correlated with infections. He notes that he has used a Malaria medication in the past. He notes that occasionally he develops acute muscle/joint pain for which he uses OTC pain medication. He notes that he has severe pain for up to 3 days after periods of over-exertion, but denies these episodes occurring unprovoked. During these 3 day periods he notes that he has to stay in bed. He also notes that he gets tired quickly and has SOB from walking around. He notes that his SOB has remained steady for his whole life.   The pt took folic acid replacement between the ages of 54 and 39.   He has never had red cell exchanges, and did not require a blood transfusion with his 2008 appendectomy. He has not had any strokes, heart attacks, or blood clots. He has had pneumonia 3 years ago.  He notes that his pain crises do not repeat at the same locations each time. He denies every being  diagnosed with avascular necrosis of bone/joint.   He denies ever having an eye examination and currently denies any problems with vision or his eyes. He also notes that he has received vaccinations but it not sure which he has received.  He notes that he drinks lots of water, but sometimes does not feel hungry enough to eat all 3 meals. He notes that he drinks 4-5 L of water each day including 1 L of hot tea from the combretum micranthum tree. He also drinks 5-6 Monster energy drinks each month.   He notes that he is generally health and has not had many problems with infections.   Most recent lab results (05/19/17) of CBC  is as follows: all values are WNL except for RBC at 3.93, Hgb at 12.0, HCT at 33.6, RDW at 17.4.   On review of systems, pt reports occasional muscle/bone pains, exercise induced SOB, fatigue, and denies skin rashes, red or swollen joints, painful joints, blood in the urine, discolored urine, chest pain, neck pain, weight changes, abdominal pains, leg pain, hand swelling, hip pain, and any other symptoms.   On PMHx the pt reports rheumatism and sickle cell disease. Denies blood clots.  On Surgical Hx the pt reports appendectomy in 2008.  On Social Hx the pt denies any ETOH consumption, and he quit smoking in 2012. He is married with 3 kids. His work entails car  repair and machine operation.  On Family Hx the pt reports paternal [place] disease.   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 The patient's last visit with [place] was on 06/17/17. The pt reports that he is doing well overall.   The pt reports that he has no new concerns.   Lab results (06/17/17) of CBC, CMP, and Reticulocytes is as follows: all values are WNL except for RBC at 3.92, Hgb at 11.4, HCT at 31.7, RDW at 17.3, Glucose at 110, Albumin at 3.0, AST at 46, ALT at 59, Alk Phos at 262, Retic Ct pct at 6.1%, Retic Ct Abs at 239.1. Ferritin 06/17/17 is WNL at  58. Iron/TIBC 06/17/17 showed Sat Ratio low at 24%. LDH 06/17/17 is WNL at 243. Vitamin B12 06/17/17 is WNL at 478. Negative alpha thalassemia.  hemoglobin electrophoresis showed Hgb C at 43.1%, Hgb S at 50.4%, Hgb A at 0%.  On review of systems, pt reports stable energy levels, and denies concerns for infection, acute bone or joint pains, and any other symptoms.   MEDICAL HISTORY:   Hemoglobin [place] disease  SURGICAL HISTORY: Past Surgical History:  Procedure Laterality Date  . APPENDECTOMY      SOCIAL HISTORY: Social History   Socioeconomic History  . Marital status: Divorced    Spouse name: Not on file  . Number of children: Not on file  . Years of education: Not on file  . Highest education level: Not on file  Occupational History  . Not on file  Social Needs  . Financial resource strain: Not on file  . Food insecurity:    Worry: Not on file    Inability: Not on file  . Transportation needs:    Medical: Not on file    Non-medical: Not on file  Tobacco Use  . Smoking status: Former Smoker    Packs/day: 0.25    Types: Cigarettes    Last attempt to quit: 2014    Years since quitting: 5.3  . Smokeless tobacco: Never Used  Substance and Sexual Activity  . Alcohol use: No    Alcohol/week: 0.0 oz  . Drug use: No  . Sexual activity: Not on file  Lifestyle  . Physical activity:    Days per week: Not on file    Minutes per session: Not on file  . Stress: Not on file  Relationships  . Social connections:    Talks on phone: Not on file    Gets together: Not on file    Attends religious service: Not on file    Active member of club or organization: Not on file    Attends meetings of clubs or organizations: Not on file    Relationship status: Not on file  . Intimate partner violence:    Fear of current or ex partner: Not on file    Emotionally abused: Not on file    Physically abused: Not on file    Forced sexual activity: Not on file  Other Topics Concern  . Not on  file  Social History Narrative  . Not on file    FAMILY HISTORY: H/o gemoglobinopathy  ALLERGIES:  has No Known Allergies.  MEDICATIONS:  No current outpatient medications on file.   No current facility-administered medications for this visit.     REVIEW OF SYSTEMS:    A 10+ POINT REVIEW OF SYSTEMS WAS OBTAINED including neurology, dermatology, psychiatry, cardiac, respiratory, lymph,  extremities, GI, GU, Musculoskeletal, constitutional, breasts, reproductive, HEENT.  All pertinent positives are noted in the HPI.  All others are negative.   PHYSICAL EXAMINATION:  . Vitals:   07/01/17 0906  BP: 125/77  Pulse: 72  Resp: 18  Temp: 97.9 F (36.6 C)  SpO2: 100%   Filed Weights   07/01/17 0906  Weight: 193 lb 6.4 oz (87.7 kg)   .Body mass index is 28.56 kg/m.  GENERAL:alert, in no acute distress and comfortable SKIN: no acute rashes, no significant lesions EYES: conjunctiva are pink and non-injected, sclera anicteric OROPHARYNX: MMM, no exudates, no oropharyngeal erythema or ulceration NECK: supple, no JVD LYMPH:  no palpable lymphadenopathy in the cervical, axillary or inguinal regions LUNGS: clear to auscultation b/l with normal respiratory effort HEART: regular rate & rhythm ABDOMEN:  normoactive bowel sounds , non tender, not distended, no hepatosplenomegaly. Extremity: no pedal edema PSYCH: alert & oriented x 3 with fluent speech NEURO: no focal motor/sensory deficits   LABORATORY DATA:  I have reviewed the data as listed  . CBC Latest Ref Rng & Units 06/17/2017 06/17/2017 12/25/2009  WBC 4.0 - 10.3 K/uL 5.0 - 5.1 ADJUSTED FOR NUCLEATED RBC'S  Hemoglobin 13.0 - 17.1 g/dL 11.4(L) - 8.5(L)  Hematocrit 37.5 - 51.0 % 32.7(L) 31.7(L) 24.4(L)  Platelets 140 - 400 K/uL 198 - 60(L)   . CBC    Component Value Date/Time   WBC 5.0 06/17/2017 1242   WBC 5.1 ADJUSTED FOR NUCLEATED RBC'S 12/25/2009 0630   RBC 3.92 (L) 06/17/2017 1242   RBC 3.92 (L) 06/17/2017 1242    HGB 11.4 (L) 06/17/2017 1242   HCT 32.7 (L) 06/17/2017 1243   PLT 198 06/17/2017 1242   MCV 80.9 06/17/2017 1242   MCH 29.1 06/17/2017 1242   MCHC 36.0 06/17/2017 1242   RDW 17.3 (H) 06/17/2017 1242   LYMPHSABS 2.0 06/17/2017 1242   MONOABS 0.5 06/17/2017 1242   EOSABS 0.2 06/17/2017 1242   BASOSABS 0.0 06/17/2017 1242   . Lab Results  Component Value Date   RETICCTPCT 6.1 (H) 06/17/2017   RBC 3.92 (L) 06/17/2017   RBC 3.92 (L) 06/17/2017     . CMP Latest Ref Rng & Units 06/17/2017 12/25/2009 12/24/2009  Glucose 70 - 140 mg/dL 80 110(H) 126(H)  BUN 7 - 26 mg/dL '11 7 11  ' Creatinine 0.70 - 1.30 mg/dL 0.88 0.73 0.76  Sodium 136 - 145 mmol/L 140 137 141  Potassium 3.5 - 5.1 mmol/L 3.9 3.6 3.7  Chloride 98 - 109 mmol/L 106 104 104  CO2 22 - 29 mmol/L '28 28 27  ' Calcium 8.4 - 10.4 mg/dL 9.7 9.2 9.5  Total Protein 6.4 - 8.3 g/dL 7.8 6.5 7.0  Total Bilirubin 0.2 - 1.2 mg/dL 1.1 0.7 0.9  Alkaline Phos 40 - 150 U/L 85 262(H) 296(H)  AST 5 - 34 U/L 32 46(H) 53(H)  ALT 0 - 55 U/L 21 59(H) 56(H)   Component     Latest Ref Rng & Units 06/17/2017  Iron     42 - 163 ug/dL 70  TIBC     202 - 409 ug/dL 296  Saturation Ratios     42 - 163 % 24 (L)  UIBC     ug/dL 226  Folate, Hemolysate     Not Estab. ng/mL 312.0  HCT     37.5 - 51.0 % 32.7 (L)  Folate, RBC     >498 ng/mL 954  Retic Ct Pct     0.8 -  1.8 % 6.1 (H)  RBC.     4.20 - 5.82 MIL/uL 3.92 (L)  Retic Count, Absolute     34.8 - 93.9 K/uL 239.1 (H)  LDH     125 - 245 U/L 243  Hep B Core Ab, Tot     Negative Negative  Hepatitis B Surface Ag     Negative Negative  HCV Ab     0.0 - 0.9 s/co ratio <0.1  HIV Screen 4th Generation wRfx     Non Reactive Non Reactive  Vitamin B12     180 - 914 pg/mL 478  Ferritin     22 - 316 ng/mL 58   Component     Latest Ref Rng & Units 06/17/2017  Hgb A2 Quant     1.8 - 3.2 % 3.9 (H)  Hgb F Quant     0.0 - 2.0 % 2.6 (H)  Hgb S Quant     0.0 % 50.4 (H)  HGB C     0.0 % 43.1 (H)    Hgb A     96.4 - 98.8 % 0.0 (L)  HGB VARIANT     0.0 % 0.0  Please Note:      Comment  Comment: (NOTE)  Hemoglobin pattern and concentrations are consistent with  S-C disease. Suggest clinical and hematologic correlation.      S-C Disease Interpretation Ranges      Hgb F    0.0 - 20.0%      Hgb S    40.0 - 60.0%      Hgb C    35.0 - 50.0%      Hgb A2    2.8 - 6.0%        RADIOGRAPHIC STUDIES: I have personally reviewed the radiological images as listed and agreed with the findings in the report. No results found.  ASSESSMENT & PLAN:  37 y.o. 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 Baseline hbg 11-12 Significant reticlocytosis as expected. -Pain crises are not unprovoked and arise after increased activity.  -not requiring recent or frequent PRBC transfusions. -no target persistently painful or swollen joints at this time. PLAN -Advised that the pt continue to stay up to date on his vaccinations to reduce his risk of infections. (functional hyposplenia related vaccination) -In general, the patient's [place] disease seems to be on the milder side.  -Advised that in times of fasting (follows Ramadan), the pt continue to drink water.  -consider baseline ECHO to evaluate for pulmonary HTN. -consider [place] abd to evaluate splenic size status. -Discussed pt labwork from 06/17/17; very mild anemia with Hgb at 11.8, Retic ct pct at 6.1%, blood chemistries are stable and bilirubin is not very high.  -Hemoglobin Sickle C disease confirmed; Hgb C at 43.1%, Hgb S at 50.4%, Hgb A at 0%. No overt associated thal trait.  -Provided supplemental information. -Discussed that folic acid and iron stores should be optimized to prevent worsened anemia -Begin taking Folic Acid 2 mg PO daily and PO Iron to maintain ferritin >50 -Maintain hydration 48-60oz daily, avoiding smoking and situations that induce low oxygen levels like very  high altitude.  -If pain crises become consistent, Hydroxyurea would be reasonable to consider. -Discussed that pt will need regular eye exams and other HB [place] related to preventive cares -Recommend vaccines to reduce the risk of infections including pneumonia vaccines and seasonal flu shots -Discussed that if pt develops isolated, acute bone or joint pain, XR  evaluation will be necessaryto evaluate for AVN of joints..  -Recommend that pt begin care with our sickle cell clinic    RTC with Dr Irene Limbo as needed F/u with sickle cell clinic     All of the patients questions were answered with apparent satisfaction. The patient knows to call the clinic with any problems, questions or concerns.  . The total time spent in the appointment was 30 minutes and more than 50% was on counseling and direct patient cares.      Sullivan Lone MD [place] AAHIVMS [facility] Hematology/Oncology Physician [facility]  (Office):       [phone] (Work cell):  [phone] (Fax):           [phone]  07/01/2017 9:46 AM  This document serves as a record of services personally performed by Sullivan Lone, MD. It was created on his behalf by Baldwin Jamaica, a trained medical scribe. The creation of this record is based on the scribe's personal observations and the provider's statements to them.   .I have reviewed the above documentation for accuracy and completeness, and I agree with the above. Brunetta Genera MD MS

## 2017-07-01 ENCOUNTER — Inpatient Hospital Stay (HOSPITAL_BASED_OUTPATIENT_CLINIC_OR_DEPARTMENT_OTHER): Payer: BLUE CROSS/BLUE SHIELD | Admitting: Hematology

## 2017-07-01 ENCOUNTER — Encounter: Payer: Self-pay | Admitting: Hematology

## 2017-07-01 VITALS — BP 125/77 | HR 72 | Temp 97.9°F | Resp 18 | Ht 69.0 in | Wt 193.4 lb

## 2017-07-01 DIAGNOSIS — D5720 Sickle-cell/Hb-C disease without crisis: Secondary | ICD-10-CM

## 2017-07-02 ENCOUNTER — Telehealth: Payer: Self-pay

## 2017-07-02 NOTE — Telephone Encounter (Signed)
RTC with Dr Candise Che as needed F/u with sickle cell clinic. Per 5/15 los

## 2017-07-07 LAB — ALPHA-THALASSEMIA GENOTYPR

## 2018-10-12 ENCOUNTER — Other Ambulatory Visit: Payer: Self-pay

## 2018-10-12 DIAGNOSIS — Z20822 Contact with and (suspected) exposure to covid-19: Secondary | ICD-10-CM

## 2018-10-13 LAB — NOVEL CORONAVIRUS, NAA: SARS-CoV-2, NAA: DETECTED — AB

## 2018-10-22 ENCOUNTER — Other Ambulatory Visit: Payer: Self-pay

## 2018-10-22 DIAGNOSIS — Z20822 Contact with and (suspected) exposure to covid-19: Secondary | ICD-10-CM

## 2018-10-24 LAB — NOVEL CORONAVIRUS, NAA: SARS-CoV-2, NAA: NOT DETECTED

## 2020-02-28 ENCOUNTER — Other Ambulatory Visit: Payer: Self-pay

## 2020-02-28 DIAGNOSIS — Z20822 Contact with and (suspected) exposure to covid-19: Secondary | ICD-10-CM

## 2020-03-01 LAB — SARS-COV-2, NAA 2 DAY TAT

## 2020-03-01 LAB — NOVEL CORONAVIRUS, NAA: SARS-CoV-2, NAA: NOT DETECTED

## 2020-03-08 ENCOUNTER — Other Ambulatory Visit: Payer: Self-pay

## 2020-03-08 DIAGNOSIS — Z20822 Contact with and (suspected) exposure to covid-19: Secondary | ICD-10-CM

## 2020-03-10 LAB — SARS-COV-2, NAA 2 DAY TAT

## 2020-03-10 LAB — NOVEL CORONAVIRUS, NAA: SARS-CoV-2, NAA: NOT DETECTED

## 2020-03-14 ENCOUNTER — Other Ambulatory Visit: Payer: Self-pay

## 2020-03-14 DIAGNOSIS — Z20822 Contact with and (suspected) exposure to covid-19: Secondary | ICD-10-CM

## 2020-03-16 LAB — NOVEL CORONAVIRUS, NAA: SARS-CoV-2, NAA: NOT DETECTED

## 2020-03-16 LAB — SARS-COV-2, NAA 2 DAY TAT
